# Patient Record
Sex: Male | Born: 1935 | Race: White | Hispanic: No | Marital: Married | Smoking: Never smoker
Health system: Southern US, Community
[De-identification: ages and names within clinical notes are randomized; demographics above are authoritative.]

## PROBLEM LIST (undated history)

## (undated) DIAGNOSIS — I82409 Acute embolism and thrombosis of unspecified deep veins of unspecified lower extremity: Secondary | ICD-10-CM

## (undated) DIAGNOSIS — Z952 Presence of prosthetic heart valve: Secondary | ICD-10-CM

## (undated) DIAGNOSIS — R52 Pain, unspecified: Secondary | ICD-10-CM

## (undated) DIAGNOSIS — I10 Essential (primary) hypertension: Secondary | ICD-10-CM

## (undated) DIAGNOSIS — K219 Gastro-esophageal reflux disease without esophagitis: Secondary | ICD-10-CM

## (undated) DIAGNOSIS — I4891 Unspecified atrial fibrillation: Secondary | ICD-10-CM

## (undated) DIAGNOSIS — I359 Nonrheumatic aortic valve disorder, unspecified: Secondary | ICD-10-CM

## (undated) DIAGNOSIS — E785 Hyperlipidemia, unspecified: Secondary | ICD-10-CM

## (undated) DIAGNOSIS — E119 Type 2 diabetes mellitus without complications: Secondary | ICD-10-CM

---

## 2004-02-01 ENCOUNTER — Other Ambulatory Visit: Payer: Self-pay

## 2004-02-22 ENCOUNTER — Inpatient Hospital Stay: Payer: Self-pay | Admitting: General Practice

## 2004-02-28 ENCOUNTER — Encounter: Payer: Self-pay | Admitting: Internal Medicine

## 2004-03-01 ENCOUNTER — Encounter: Payer: Self-pay | Admitting: Internal Medicine

## 2004-03-31 ENCOUNTER — Encounter: Payer: Self-pay | Admitting: Internal Medicine

## 2004-05-01 ENCOUNTER — Encounter: Payer: Self-pay | Admitting: Internal Medicine

## 2004-06-01 ENCOUNTER — Encounter: Payer: Self-pay | Admitting: Internal Medicine

## 2004-06-29 ENCOUNTER — Encounter: Payer: Self-pay | Admitting: Internal Medicine

## 2004-07-30 ENCOUNTER — Encounter: Payer: Self-pay | Admitting: Internal Medicine

## 2004-08-29 ENCOUNTER — Encounter: Payer: Self-pay | Admitting: Internal Medicine

## 2004-09-29 ENCOUNTER — Encounter: Payer: Self-pay | Admitting: Internal Medicine

## 2004-10-29 ENCOUNTER — Encounter: Payer: Self-pay | Admitting: Internal Medicine

## 2004-11-29 ENCOUNTER — Encounter: Payer: Self-pay | Admitting: Internal Medicine

## 2004-12-30 ENCOUNTER — Encounter: Payer: Self-pay | Admitting: Internal Medicine

## 2005-01-29 ENCOUNTER — Encounter: Payer: Self-pay | Admitting: Internal Medicine

## 2005-03-01 ENCOUNTER — Encounter: Payer: Self-pay | Admitting: Internal Medicine

## 2005-03-31 ENCOUNTER — Encounter: Payer: Self-pay | Admitting: Internal Medicine

## 2005-05-01 ENCOUNTER — Encounter: Payer: Self-pay | Admitting: Internal Medicine

## 2005-06-01 ENCOUNTER — Encounter: Payer: Self-pay | Admitting: Internal Medicine

## 2005-06-29 ENCOUNTER — Encounter: Payer: Self-pay | Admitting: Internal Medicine

## 2005-07-30 ENCOUNTER — Encounter: Payer: Self-pay | Admitting: Internal Medicine

## 2005-08-29 ENCOUNTER — Encounter: Payer: Self-pay | Admitting: Internal Medicine

## 2005-09-29 ENCOUNTER — Encounter: Payer: Self-pay | Admitting: Internal Medicine

## 2005-10-29 ENCOUNTER — Encounter: Payer: Self-pay | Admitting: Internal Medicine

## 2005-11-29 ENCOUNTER — Encounter: Payer: Self-pay | Admitting: Internal Medicine

## 2005-12-30 ENCOUNTER — Encounter: Payer: Self-pay | Admitting: Internal Medicine

## 2006-01-29 ENCOUNTER — Encounter: Payer: Self-pay | Admitting: Internal Medicine

## 2006-03-01 ENCOUNTER — Encounter: Payer: Self-pay | Admitting: Internal Medicine

## 2007-05-07 ENCOUNTER — Encounter: Payer: Self-pay | Admitting: Internal Medicine

## 2007-06-02 ENCOUNTER — Encounter: Payer: Self-pay | Admitting: Internal Medicine

## 2008-02-21 ENCOUNTER — Ambulatory Visit: Payer: Self-pay | Admitting: Cardiology

## 2014-11-03 ENCOUNTER — Encounter: Payer: Self-pay | Admitting: *Deleted

## 2014-11-03 ENCOUNTER — Emergency Department
Admission: EM | Admit: 2014-11-03 | Discharge: 2014-11-03 | Disposition: A | Discharge status: Home / Self Care | Payer: Commercial Managed Care - HMO | Attending: Emergency Medicine | Admitting: Emergency Medicine

## 2014-11-03 DIAGNOSIS — G8929 Other chronic pain: Secondary | ICD-10-CM | POA: Insufficient documentation

## 2014-11-03 DIAGNOSIS — M549 Dorsalgia, unspecified: Secondary | ICD-10-CM | POA: Insufficient documentation

## 2014-11-03 DIAGNOSIS — I10 Essential (primary) hypertension: Secondary | ICD-10-CM | POA: Insufficient documentation

## 2014-11-03 DIAGNOSIS — Z79899 Other long term (current) drug therapy: Secondary | ICD-10-CM | POA: Diagnosis not present

## 2014-11-03 DIAGNOSIS — I4891 Unspecified atrial fibrillation: Secondary | ICD-10-CM | POA: Diagnosis not present

## 2014-11-03 DIAGNOSIS — Z7901 Long term (current) use of anticoagulants: Secondary | ICD-10-CM | POA: Diagnosis not present

## 2014-11-03 DIAGNOSIS — E119 Type 2 diabetes mellitus without complications: Secondary | ICD-10-CM | POA: Diagnosis not present

## 2014-11-03 DIAGNOSIS — R000 Tachycardia, unspecified: Secondary | ICD-10-CM | POA: Diagnosis present

## 2014-11-03 HISTORY — DX: Nonrheumatic aortic valve disorder, unspecified: I35.9

## 2014-11-03 HISTORY — DX: Essential (primary) hypertension: I10

## 2014-11-03 HISTORY — DX: Hyperlipidemia, unspecified: E78.5

## 2014-11-03 HISTORY — DX: Presence of prosthetic heart valve: Z95.2

## 2014-11-03 HISTORY — DX: Gastro-esophageal reflux disease without esophagitis: K21.9

## 2014-11-03 HISTORY — DX: Pain, unspecified: R52

## 2014-11-03 HISTORY — DX: Acute embolism and thrombosis of unspecified deep veins of unspecified lower extremity: I82.409

## 2014-11-03 HISTORY — DX: Type 2 diabetes mellitus without complications: E11.9

## 2014-11-03 HISTORY — DX: Unspecified atrial fibrillation: I48.91

## 2014-11-03 LAB — BASIC METABOLIC PANEL
ANION GAP: 9 (ref 5–15)
BUN: 17 mg/dL (ref 6–20)
CO2: 29 mmol/L (ref 22–32)
Calcium: 9.6 mg/dL (ref 8.9–10.3)
Chloride: 106 mmol/L (ref 101–111)
Creatinine, Ser: 0.92 mg/dL (ref 0.61–1.24)
GFR calc Af Amer: >60 mL/min (ref >60)
Glucose, Bld: 105 mg/dL — ABNORMAL HIGH (ref 65–99)
Potassium: 3.9 mmol/L (ref 3.5–5.1)
SODIUM: 144 mmol/L (ref 135–145)

## 2014-11-03 LAB — CBC
HEMATOCRIT: 42.2 % (ref 40.0–52.0)
Hemoglobin: 13.8 g/dL (ref 13.0–18.0)
MCH: 31.7 pg (ref 26.0–34.0)
MCHC: 32.6 g/dL (ref 32.0–36.0)
MCV: 97.1 fL (ref 80.0–100.0)
PLATELETS: 213 10*3/uL (ref 150–440)
RBC: 4.35 MIL/uL — ABNORMAL LOW (ref 4.40–5.90)
RDW: 13.4 % (ref 11.5–14.5)
WBC: 5.9 10*3/uL (ref 3.8–10.6)

## 2014-11-03 LAB — PROTIME-INR
INR: 1.75
Prothrombin Time: 20.6 seconds — ABNORMAL HIGH (ref 11.4–15.0)

## 2014-11-03 LAB — TROPONIN I: Troponin I: <0.03 ng/mL (ref <0.031)

## 2014-11-03 NOTE — ED Notes (Addendum)
Pt to ed with c/o rapid heart rate earlier today,  Pt states he went to [facility] and obtained an EKG,  Pt then was told to go to be evaluated.  Pt brought to ed from [facility],  [place] from [facility] reported bp 158/80 and HR of 80 and irregular HR.  Pt denies chest pain at this time.

## 2014-11-03 NOTE — ED Notes (Signed)

## 2014-11-03 NOTE — ED Provider Notes (Signed)
[facility] Emergency Department Provider Note  ____________________________________________  Time seen: Approximately 7:22 PM  I have reviewed the triage vital signs and the nursing notes.   HISTORY  Chief Complaint Hypertension and Tachycardia    HPI Danny Bernard is a 81 y.o. male who said that he went to the fire department today to have his blood pressure checked as he often does, but he also noticed briefly after taking a nap at his heart felt like it was beating fast,  while at [place] he was told his heart rate was fast, and they advised him to come to the hospital.  Mr. Danny Bernard denies having any symptoms. No chest pain or shortness of breath. He states that he is "riddled with arthritis, but could play shortstop and again if he needed to you." I discussed with him for a while, and he and his wife state that he does have a history of atrial fibrillation and he states that he has had that as long as he can remember and is under Dr. Velia Meyer care. He denies any other symptoms. No trouble breathing, no shortness of breath, no swelling in his legs, no chest pain or other concerns.  He feels well at this time.   Past Medical History  Diagnosis Date  . Heart valve replaced   . Hypertension   . Pain   . GERD (gastroesophageal reflux disease)   . DVT (deep venous thrombosis)   . Diabetes mellitus without complication   . A-fib   . Aortic valve disease   . Hyperlipemia     There are no active problems to display for this patient.   History reviewed. No pertinent past surgical history.  Current Outpatient Rx  Name  Route  Sig  Dispense  Refill  . amLODipine (NORVASC) 10 MG tablet   Oral   Take 1 tablet by mouth daily.         . hydrochlorothiazide (HYDRODIURIL) 25 MG tablet   Oral   Take 1 tablet by mouth daily.         Marland Kitchen losartan (COZAAR) 100 MG tablet   Oral   Take 1 tablet by mouth daily.         Marland Kitchen omeprazole (PRILOSEC) 40 MG  capsule   Oral   Take 1 capsule by mouth daily.         Marland Kitchen warfarin (COUMADIN) 5 MG tablet   Oral   Take 1 tablet by mouth daily. Take 1 tablet on day and 1/2 a tablet the next day.           Allergies Review of patient's allergies indicates no known allergies.  No family history on file.  Social History History  Substance Use Topics  . Smoking status: Never Smoker   . Smokeless tobacco: Never Used  . Alcohol Use: No    Review of Systems Constitutional: No fever/chills Eyes: No visual changes. ENT: No sore throat. Cardiovascular: Denies chest pain. Respiratory: Denies shortness of breath. Gastrointestinal: No abdominal pain.  No nausea, no vomiting.  No diarrhea.  No constipation. Genitourinary: Negative for dysuria. Musculoskeletal: Chronic long-standing back pain, and severe arthritis throughout his back and all joints. Skin: Negative for rash. Neurological: Negative for headaches, focal weakness or numbness.  10-point ROS otherwise negative.  ____________________________________________   PHYSICAL EXAM:  VITAL SIGNS: ED Triage Vitals  Enc Vitals Group     BP 11/03/14 1653 163/102 mmHg     Pulse Rate 11/03/14 1653 56  Resp 11/03/14 1653 18     Temp 11/03/14 1653 98.2 F (36.8 C)     Temp Source 11/03/14 1653 Oral     SpO2 11/03/14 1653 97 %     Weight 11/03/14 1653 251 lb (113.853 kg)     Height 11/03/14 1653 5\' 7"  (1.702 m)     Head Cir --      Peak Flow --      Pain Score --      Pain Loc --      Pain Edu? --      Excl. in [place]? --     Constitutional: Alert and oriented. Well appearing and in no acute distress. Patient is very very hard of hearing, and at times this seems to make him appear confused, though in speaking up and asking questions clearly he does respond well. He reports that it is July, 2016, and he is able to give good history of today's events. Eyes: Conjunctivae are normal. PERRL. EOMI. Head: Atraumatic. Nose: No  congestion/rhinnorhea. Mouth/Throat: Mucous membranes are moist.  Oropharynx non-erythematous. Neck: No stridor.   Cardiovascular: Irregularly irregular rhythm. Grossly normal heart sounds.  Good peripheral circulation. Respiratory: Normal respiratory effort.  No retractions. Lungs CTAB. Gastrointestinal: Soft and nontender. No distention. No abdominal bruits. No CVA tenderness. Musculoskeletal: No lower extremity tenderness nor edema.  No joint effusions. Neurologic:  Normal speech and language. No gross focal neurologic deficits are appreciated. Speech is normal. No gait instability. Skin:  Skin is warm, dry and intact. No rash noted. Psychiatric: Mood and affect are normal. Speech and behavior are normal.  ____________________________________________   LABS (all labs ordered are listed, but only abnormal results are displayed)  Labs Reviewed  BASIC METABOLIC PANEL - Abnormal; Notable for the following:    Glucose, Bld 105 (*)    All other components within normal limits  CBC - Abnormal; Notable for the following:    RBC 4.35 (*)    All other components within normal limits  PROTIME-INR - Abnormal; Notable for the following:    Prothrombin Time 20.6 (*)    All other components within normal limits  TROPONIN I   ____________________________________________  EKG  Reviewed and interpreted by me Atrial fibrillation with occasional PVCs. Right bundle-branch block. There is no evidence of acute ischemic abnormality. Rate is controlled at 90.  QRS 142 QTC 500  Aside from right bundle-branch block and atrial fibrillation is otherwise unremarkable, I do not see any evidence of acute ischemia. ____________________________________________  RADIOLOGY   ____________________________________________   PROCEDURES  Procedure(s) performed: None  Critical Care performed: No  ____________________________________________   INITIAL IMPRESSION / ASSESSMENT AND PLAN / ED  COURSE  Pertinent labs & imaging results that were available during my care of the patient were reviewed by me and considered in my medical decision making (see chart for details).  Patient presents for evaluation of elevated heart rate for which she appears to be asymptomatic at the fire department today. He is alert, slightly confused as to some details but his wife reports this is chronic and he is thought to have some mild dementia. His EKG shows atrial fibrillation that is well controlled with a rate in the 90s. He is not unstable and not having any active cardiopulmonary complaint and denies having had any associated symptoms.  Per the patient and his wife this is long-standing. As he is asymptomatic and there is no acute concern, I am going to discharge him and refer him to Dr.  Fath for follow-up.  Patient and his wife are both very agreeable with the plan.  : Spoke with Dr. Ubaldo Glassing, we reviewed patient's symptoms, EKG, and laboratory analysis. Dr. Satira Mccallum recommends discharging the patient to home and that he can call and they will set up a clinic appointment for the patient in the next 1-2 days. I discussed this with the patient and his wife both are agreeable with this and return precautions including any chest pain, weakness, trouble breathing, or other new concerns.   ____________________________________________   FINAL CLINICAL IMPRESSION(S) / ED DIAGNOSES  Final diagnoses:  Atrial fibrillation, unspecified      Delman Kitten, MD 11/03/14 1958

## 2014-11-03 NOTE — Discharge Instructions (Signed)
Atrial Fibrillation  Please call Dr. Bethanne Ginger office at 8 AM to set up a follow-up appointment within the next 1-2 days. Return to the ER right away if you develop any chest pain, trouble breathing, weakness, feel lightheaded or grossly may pass out, noticed her heart is racing, or other new concerns arise.  Atrial fibrillation is a type of irregular heart rhythm (arrhythmia). During atrial fibrillation, the upper chambers of the heart (atria) quiver continuously in a chaotic pattern. This causes an irregular and often rapid heart rate.  Atrial fibrillation is the result of the heart becoming overloaded with disorganized signals that tell it to beat. These signals are normally released one at a time by a part of the right atrium called the sinoatrial node. They then travel from the atria to the lower chambers of the heart (ventricles), causing the atria and ventricles to contract and pump blood as they pass. In atrial fibrillation, parts of the atria outside of the sinoatrial node also release these signals. This results in two problems. First, the atria receive so many signals that they do not have time to fully contract. Second, the ventricles, which can only receive one signal at a time, beat irregularly and out of rhythm with the atria.  There are three types of atrial fibrillation:   Paroxysmal. Paroxysmal atrial fibrillation starts suddenly and stops on its own within a week.  Persistent. Persistent atrial fibrillation lasts for more than a week. It may stop on its own or with treatment.  Permanent. Permanent atrial fibrillation does not go away. Episodes of atrial fibrillation may lead to permanent atrial fibrillation. Atrial fibrillation can prevent your heart from pumping blood normally. It increases your risk of stroke and can lead to heart failure.  CAUSES   Heart conditions, including a heart attack, heart failure, coronary artery disease, and heart valve conditions.   Inflammation of  the sac that surrounds the heart (pericarditis).  Blockage of an artery in the lungs (pulmonary embolism).  Pneumonia or other infections.  Chronic lung disease.  Thyroid problems, especially if the thyroid is overactive (hyperthyroidism).  Caffeine, excessive alcohol use, and use of some illegal drugs.   Use of some medicines, including certain decongestants and diet pills.  Heart surgery.   Birth defects.  Sometimes, no cause can be found. When this happens, the atrial fibrillation is called lone atrial fibrillation. The risk of complications from atrial fibrillation increases if you have lone atrial fibrillation and you are age 76 years or older. RISK FACTORS  Heart failure.  Coronary artery disease.  Diabetes mellitus.   High blood pressure (hypertension).   Obesity.   Other arrhythmias.   Increased age. SIGNS AND SYMPTOMS   A feeling that your heart is beating rapidly or irregularly.   A feeling of discomfort or pain in your chest.   Shortness of breath.   Sudden light-headedness or weakness.   Getting tired easily when exercising.   Urinating more often than normal (mainly when atrial fibrillation first begins).  In paroxysmal atrial fibrillation, symptoms may start and suddenly stop. DIAGNOSIS  Your health care provider may be able to detect atrial fibrillation when taking your pulse. Your health care provider may have you take a test called an ambulatory electrocardiogram (ECG). An ECG records your heartbeat patterns over a 24-hour period. You may also have other tests, such as:  Transthoracic echocardiogram (TTE). During echocardiography, sound waves are used to evaluate how blood flows through your heart.  Transesophageal echocardiogram (TEE).  Stress  test. There is more than one type of stress test. If a stress test is needed, ask your health care provider about which type is best for you.  Chest X-ray exam.  Blood tests.  Computed  tomography (CT). TREATMENT  Treatment may include:  Treating any underlying conditions. For example, if you have an overactive thyroid, treating the condition may correct atrial fibrillation.  Taking medicine. Medicines may be given to control a rapid heart rate or to prevent blood clots, heart failure, or a stroke.  Having a procedure to correct the rhythm of the heart:  Electrical cardioversion. During electrical cardioversion, a controlled, low-energy shock is delivered to the heart through your skin. If you have chest pain, very low blood pressure, or sudden heart failure, this procedure may need to be done as an emergency.  Catheter ablation. During this procedure, heart tissues that send the signals that cause atrial fibrillation are destroyed.  Surgical ablation. During this surgery, thin lines of heart tissue that carry the abnormal signals are destroyed. This procedure can either be an open-heart surgery or a minimally invasive surgery. With the minimally invasive surgery, small cuts are made to access the heart instead of a large opening.  Pulmonary venous isolation. During this surgery, tissue around the veins that carry blood from the lungs (pulmonary veins) is destroyed. This tissue is thought to carry the abnormal signals. HOME CARE INSTRUCTIONS   Take medicines only as directed by your health care provider. Some medicines can make atrial fibrillation worse or recur.  If blood thinners were prescribed by your health care provider, take them exactly as directed. Too much blood-thinning medicine can cause bleeding. If you take too little, you will not have the needed protection against stroke and other problems.  Perform blood tests at home if directed by your health care provider. Perform blood tests exactly as directed.  Quit smoking if you smoke.  Do not drink alcohol.  Do not drink caffeinated beverages such as coffee, soda, and some teas. You may drink decaffeinated  coffee, soda, or tea.   Maintain a healthy weight.Do not use diet pills unless your health care provider approves. They may make heart problems worse.   Follow diet instructions as directed by your health care provider.  Exercise regularly as directed by your health care provider.  Keep all follow-up visits as directed by your health care provider. This is important. PREVENTION  The following substances can cause atrial fibrillation to recur:   Caffeinated beverages.  Alcohol.  Certain medicines, especially those used for breathing problems.  Certain herbs and herbal medicines, such as those containing ephedra or ginseng.  Illegal drugs, such as cocaine and amphetamines. Sometimes medicines are given to prevent atrial fibrillation from recurring. Proper treatment of any underlying condition is also important in helping prevent recurrence.  SEEK MEDICAL CARE IF:  You notice a change in the rate, rhythm, or strength of your heartbeat.  You suddenly begin urinating more frequently.  You tire more easily when exerting yourself or exercising. SEEK IMMEDIATE MEDICAL CARE IF:   You have chest pain, abdominal pain, sweating, or weakness.  You feel nauseous.  You have shortness of breath.  You suddenly have swollen feet and ankles.  You feel dizzy.  Your face or limbs feel numb or weak.  You have a change in your vision or speech. MAKE SURE YOU:   Understand these instructions.  Will watch your condition.  Will get help right away if you are not doing well  or get worse. Document Released: 04/17/2005 Document Revised: 09/01/2013 Document Reviewed: 05/28/2012 [facility] Patient Information 2015 [place], [place]. This information is not intended to replace advice given to you by your health care provider. Make sure you discuss any questions you have with your health care provider.

## 2014-11-03 NOTE — ED Notes (Signed)
Patient present to ED with complaint of heart racing earlier today, per wife patient has history of "irregular heart beat at times." Patient states went to fire dept to check blood pressure, states "I didn't trust my thing (blood pressure machine.)" Patient appears confused at times. When asked if patient has history of atrial fibrillation patient reports "I had it my entire life." Patient denies chest pain, abdominal pain, or fever. Patient states he was taking a nap and reports when he woke up "my heart was beating real fast." Patient reports "it's the only time it has ever felt like that." Patient alert, calm and cooperative. Family at bedside, patient placed on cardiac monitor, MD at bedside.

## 2014-11-03 NOTE — ED Notes (Signed)
Pt here with c/o heart racing earlier.  Pt advises it's "OK" now but he doesn't feel good.

## 2015-05-27 DIAGNOSIS — E119 Type 2 diabetes mellitus without complications: Secondary | ICD-10-CM | POA: Diagnosis not present

## 2015-05-27 DIAGNOSIS — Z0000 Encounter for general adult medical examination without abnormal findings: Secondary | ICD-10-CM | POA: Diagnosis not present

## 2015-05-27 DIAGNOSIS — Z79899 Other long term (current) drug therapy: Secondary | ICD-10-CM | POA: Diagnosis not present

## 2015-07-20 DIAGNOSIS — B351 Tinea unguium: Secondary | ICD-10-CM | POA: Diagnosis not present

## 2015-07-20 DIAGNOSIS — E1142 Type 2 diabetes mellitus with diabetic polyneuropathy: Secondary | ICD-10-CM | POA: Diagnosis not present

## 2015-07-20 DIAGNOSIS — L851 Acquired keratosis [keratoderma] palmaris et plantaris: Secondary | ICD-10-CM | POA: Diagnosis not present

## 2015-07-29 DIAGNOSIS — L578 Other skin changes due to chronic exposure to nonionizing radiation: Secondary | ICD-10-CM | POA: Diagnosis not present

## 2015-07-29 DIAGNOSIS — L821 Other seborrheic keratosis: Secondary | ICD-10-CM | POA: Diagnosis not present

## 2015-07-29 DIAGNOSIS — L820 Inflamed seborrheic keratosis: Secondary | ICD-10-CM | POA: Diagnosis not present

## 2015-07-29 DIAGNOSIS — D229 Melanocytic nevi, unspecified: Secondary | ICD-10-CM | POA: Diagnosis not present

## 2015-07-29 DIAGNOSIS — L98421 Non-pressure chronic ulcer of back limited to breakdown of skin: Secondary | ICD-10-CM | POA: Diagnosis not present

## 2015-08-19 DIAGNOSIS — L820 Inflamed seborrheic keratosis: Secondary | ICD-10-CM | POA: Diagnosis not present

## 2015-08-19 DIAGNOSIS — L821 Other seborrheic keratosis: Secondary | ICD-10-CM | POA: Diagnosis not present

## 2015-08-19 DIAGNOSIS — D485 Neoplasm of uncertain behavior of skin: Secondary | ICD-10-CM | POA: Diagnosis not present

## 2015-08-19 DIAGNOSIS — Z85828 Personal history of other malignant neoplasm of skin: Secondary | ICD-10-CM | POA: Diagnosis not present

## 2015-08-19 DIAGNOSIS — L578 Other skin changes due to chronic exposure to nonionizing radiation: Secondary | ICD-10-CM | POA: Diagnosis not present

## 2015-08-19 DIAGNOSIS — L570 Actinic keratosis: Secondary | ICD-10-CM | POA: Diagnosis not present

## 2015-08-19 DIAGNOSIS — Z1283 Encounter for screening for malignant neoplasm of skin: Secondary | ICD-10-CM | POA: Diagnosis not present

## 2015-09-13 DIAGNOSIS — R319 Hematuria, unspecified: Secondary | ICD-10-CM | POA: Diagnosis not present

## 2015-10-19 DIAGNOSIS — Z79899 Other long term (current) drug therapy: Secondary | ICD-10-CM | POA: Diagnosis not present

## 2015-10-19 DIAGNOSIS — E119 Type 2 diabetes mellitus without complications: Secondary | ICD-10-CM | POA: Diagnosis not present

## 2015-10-21 DIAGNOSIS — D1800 Hemangioma unspecified site: Secondary | ICD-10-CM | POA: Diagnosis not present

## 2015-10-21 DIAGNOSIS — L820 Inflamed seborrheic keratosis: Secondary | ICD-10-CM | POA: Diagnosis not present

## 2015-10-21 DIAGNOSIS — L853 Xerosis cutis: Secondary | ICD-10-CM | POA: Diagnosis not present

## 2015-10-21 DIAGNOSIS — L812 Freckles: Secondary | ICD-10-CM | POA: Diagnosis not present

## 2015-10-21 DIAGNOSIS — D229 Melanocytic nevi, unspecified: Secondary | ICD-10-CM | POA: Diagnosis not present

## 2015-10-21 DIAGNOSIS — L578 Other skin changes due to chronic exposure to nonionizing radiation: Secondary | ICD-10-CM | POA: Diagnosis not present

## 2015-10-21 DIAGNOSIS — L570 Actinic keratosis: Secondary | ICD-10-CM | POA: Diagnosis not present

## 2015-10-21 DIAGNOSIS — L821 Other seborrheic keratosis: Secondary | ICD-10-CM | POA: Diagnosis not present

## 2015-10-26 DIAGNOSIS — I10 Essential (primary) hypertension: Secondary | ICD-10-CM | POA: Diagnosis not present

## 2015-10-26 DIAGNOSIS — E119 Type 2 diabetes mellitus without complications: Secondary | ICD-10-CM | POA: Diagnosis not present

## 2015-10-26 DIAGNOSIS — K219 Gastro-esophageal reflux disease without esophagitis: Secondary | ICD-10-CM | POA: Diagnosis not present

## 2015-10-26 DIAGNOSIS — Z79899 Other long term (current) drug therapy: Secondary | ICD-10-CM | POA: Diagnosis not present

## 2015-10-26 DIAGNOSIS — I482 Chronic atrial fibrillation: Secondary | ICD-10-CM | POA: Diagnosis not present

## 2015-11-09 DIAGNOSIS — S93505A Unspecified sprain of left lesser toe(s), initial encounter: Secondary | ICD-10-CM | POA: Diagnosis not present

## 2015-11-09 DIAGNOSIS — M79672 Pain in left foot: Secondary | ICD-10-CM | POA: Diagnosis not present

## 2015-12-17 DIAGNOSIS — Z79899 Other long term (current) drug therapy: Secondary | ICD-10-CM | POA: Diagnosis not present

## 2016-01-14 DIAGNOSIS — L578 Other skin changes due to chronic exposure to nonionizing radiation: Secondary | ICD-10-CM | POA: Diagnosis not present

## 2016-01-14 DIAGNOSIS — D045 Carcinoma in situ of skin of trunk: Secondary | ICD-10-CM | POA: Diagnosis not present

## 2016-01-14 DIAGNOSIS — C44519 Basal cell carcinoma of skin of other part of trunk: Secondary | ICD-10-CM | POA: Diagnosis not present

## 2016-01-14 DIAGNOSIS — L821 Other seborrheic keratosis: Secondary | ICD-10-CM | POA: Diagnosis not present

## 2016-01-14 DIAGNOSIS — D485 Neoplasm of uncertain behavior of skin: Secondary | ICD-10-CM | POA: Diagnosis not present

## 2016-01-14 DIAGNOSIS — D229 Melanocytic nevi, unspecified: Secondary | ICD-10-CM | POA: Diagnosis not present

## 2016-01-14 DIAGNOSIS — L812 Freckles: Secondary | ICD-10-CM | POA: Diagnosis not present

## 2016-02-15 DIAGNOSIS — D045 Carcinoma in situ of skin of trunk: Secondary | ICD-10-CM | POA: Diagnosis not present

## 2016-02-15 DIAGNOSIS — C44519 Basal cell carcinoma of skin of other part of trunk: Secondary | ICD-10-CM | POA: Diagnosis not present

## 2016-04-07 DIAGNOSIS — I10 Essential (primary) hypertension: Secondary | ICD-10-CM | POA: Diagnosis not present

## 2016-04-07 DIAGNOSIS — I482 Chronic atrial fibrillation: Secondary | ICD-10-CM | POA: Diagnosis not present

## 2016-04-07 DIAGNOSIS — Z79899 Other long term (current) drug therapy: Secondary | ICD-10-CM | POA: Diagnosis not present

## 2016-04-07 DIAGNOSIS — R42 Dizziness and giddiness: Secondary | ICD-10-CM | POA: Diagnosis not present

## 2016-05-11 DIAGNOSIS — E1142 Type 2 diabetes mellitus with diabetic polyneuropathy: Secondary | ICD-10-CM | POA: Diagnosis not present

## 2016-06-07 DIAGNOSIS — L851 Acquired keratosis [keratoderma] palmaris et plantaris: Secondary | ICD-10-CM | POA: Diagnosis not present

## 2016-06-07 DIAGNOSIS — E1142 Type 2 diabetes mellitus with diabetic polyneuropathy: Secondary | ICD-10-CM | POA: Diagnosis not present

## 2016-06-07 DIAGNOSIS — B351 Tinea unguium: Secondary | ICD-10-CM | POA: Diagnosis not present

## 2016-06-14 DIAGNOSIS — D485 Neoplasm of uncertain behavior of skin: Secondary | ICD-10-CM | POA: Diagnosis not present

## 2016-06-14 DIAGNOSIS — Z85828 Personal history of other malignant neoplasm of skin: Secondary | ICD-10-CM | POA: Diagnosis not present

## 2016-06-14 DIAGNOSIS — L578 Other skin changes due to chronic exposure to nonionizing radiation: Secondary | ICD-10-CM | POA: Diagnosis not present

## 2016-06-14 DIAGNOSIS — D229 Melanocytic nevi, unspecified: Secondary | ICD-10-CM | POA: Diagnosis not present

## 2016-06-14 DIAGNOSIS — C44529 Squamous cell carcinoma of skin of other part of trunk: Secondary | ICD-10-CM | POA: Diagnosis not present

## 2016-06-14 DIAGNOSIS — L821 Other seborrheic keratosis: Secondary | ICD-10-CM | POA: Diagnosis not present

## 2016-06-14 DIAGNOSIS — L820 Inflamed seborrheic keratosis: Secondary | ICD-10-CM | POA: Diagnosis not present

## 2016-06-14 DIAGNOSIS — Z1283 Encounter for screening for malignant neoplasm of skin: Secondary | ICD-10-CM | POA: Diagnosis not present

## 2016-06-14 DIAGNOSIS — L570 Actinic keratosis: Secondary | ICD-10-CM | POA: Diagnosis not present

## 2016-06-29 ENCOUNTER — Emergency Department: Payer: PPO

## 2016-06-29 ENCOUNTER — Encounter: Payer: Self-pay | Admitting: Emergency Medicine

## 2016-06-29 ENCOUNTER — Emergency Department
Admission: EM | Admit: 2016-06-29 | Discharge: 2016-06-29 | Disposition: A | Discharge status: Home / Self Care | Payer: PPO | Attending: Emergency Medicine | Admitting: Emergency Medicine

## 2016-06-29 DIAGNOSIS — I10 Essential (primary) hypertension: Secondary | ICD-10-CM | POA: Insufficient documentation

## 2016-06-29 DIAGNOSIS — Y939 Activity, unspecified: Secondary | ICD-10-CM | POA: Diagnosis not present

## 2016-06-29 DIAGNOSIS — M79601 Pain in right arm: Secondary | ICD-10-CM | POA: Diagnosis not present

## 2016-06-29 DIAGNOSIS — Y999 Unspecified external cause status: Secondary | ICD-10-CM | POA: Insufficient documentation

## 2016-06-29 DIAGNOSIS — W1839XA Other fall on same level, initial encounter: Secondary | ICD-10-CM | POA: Diagnosis not present

## 2016-06-29 DIAGNOSIS — R102 Pelvic and perineal pain: Secondary | ICD-10-CM | POA: Diagnosis not present

## 2016-06-29 DIAGNOSIS — E119 Type 2 diabetes mellitus without complications: Secondary | ICD-10-CM | POA: Diagnosis not present

## 2016-06-29 DIAGNOSIS — S3992XA Unspecified injury of lower back, initial encounter: Secondary | ICD-10-CM | POA: Diagnosis not present

## 2016-06-29 DIAGNOSIS — S3993XA Unspecified injury of pelvis, initial encounter: Secondary | ICD-10-CM | POA: Diagnosis not present

## 2016-06-29 DIAGNOSIS — W19XXXA Unspecified fall, initial encounter: Secondary | ICD-10-CM | POA: Diagnosis not present

## 2016-06-29 DIAGNOSIS — M25551 Pain in right hip: Secondary | ICD-10-CM | POA: Diagnosis not present

## 2016-06-29 DIAGNOSIS — M549 Dorsalgia, unspecified: Secondary | ICD-10-CM | POA: Diagnosis not present

## 2016-06-29 DIAGNOSIS — Z79899 Other long term (current) drug therapy: Secondary | ICD-10-CM | POA: Diagnosis not present

## 2016-06-29 DIAGNOSIS — M79604 Pain in right leg: Secondary | ICD-10-CM | POA: Diagnosis not present

## 2016-06-29 DIAGNOSIS — S300XXA Contusion of lower back and pelvis, initial encounter: Secondary | ICD-10-CM | POA: Diagnosis not present

## 2016-06-29 DIAGNOSIS — Y92009 Unspecified place in unspecified non-institutional (private) residence as the place of occurrence of the external cause: Secondary | ICD-10-CM | POA: Diagnosis not present

## 2016-06-29 DIAGNOSIS — M79651 Pain in right thigh: Secondary | ICD-10-CM | POA: Diagnosis not present

## 2016-06-29 DIAGNOSIS — M546 Pain in thoracic spine: Secondary | ICD-10-CM | POA: Diagnosis not present

## 2016-06-29 DIAGNOSIS — S79911A Unspecified injury of right hip, initial encounter: Secondary | ICD-10-CM | POA: Diagnosis not present

## 2016-06-29 LAB — COMPREHENSIVE METABOLIC PANEL
ALK PHOS: 56 U/L (ref 38–126)
ALT: 18 U/L (ref 17–63)
ANION GAP: 7 (ref 5–15)
AST: 23 U/L (ref 15–41)
Albumin: 4 g/dL (ref 3.5–5.0)
BILIRUBIN TOTAL: 1.2 mg/dL (ref 0.3–1.2)
BUN: 19 mg/dL (ref 6–20)
CALCIUM: 9.5 mg/dL (ref 8.9–10.3)
CO2: 30 mmol/L (ref 22–32)
Chloride: 104 mmol/L (ref 101–111)
Creatinine, Ser: 0.85 mg/dL (ref 0.61–1.24)
GFR calc Af Amer: >60 mL/min (ref >60)
Glucose, Bld: 152 mg/dL — ABNORMAL HIGH (ref 65–99)
POTASSIUM: 4 mmol/L (ref 3.5–5.1)
Sodium: 141 mmol/L (ref 135–145)
TOTAL PROTEIN: 7 g/dL (ref 6.5–8.1)

## 2016-06-29 LAB — CBC WITH DIFFERENTIAL/PLATELET
Basophils Absolute: 0 10*3/uL (ref 0–0.1)
Basophils Relative: 0 %
Eosinophils Absolute: 0 10*3/uL (ref 0–0.7)
Eosinophils Relative: 0 %
HEMATOCRIT: 39.7 % — AB (ref 40.0–52.0)
HEMOGLOBIN: 13.7 g/dL (ref 13.0–18.0)
LYMPHS PCT: 7 %
Lymphs Abs: 0.8 10*3/uL — ABNORMAL LOW (ref 1.0–3.6)
MCH: 33.5 pg (ref 26.0–34.0)
MCHC: 34.4 g/dL (ref 32.0–36.0)
MCV: 97.2 fL (ref 80.0–100.0)
MONO ABS: 0.9 10*3/uL (ref 0.2–1.0)
MONOS PCT: 8 %
NEUTROS ABS: 9.3 10*3/uL — AB (ref 1.4–6.5)
NEUTROS PCT: 85 %
Platelets: 242 10*3/uL (ref 150–440)
RBC: 4.08 MIL/uL — ABNORMAL LOW (ref 4.40–5.90)
RDW: 14 % (ref 11.5–14.5)
WBC: 11 10*3/uL — ABNORMAL HIGH (ref 3.8–10.6)

## 2016-06-29 MED ORDER — TRAMADOL HCL 50 MG PO TABS
50.0000 mg | ORAL_TABLET | ORAL | Status: AC
  Administered 2016-06-29: 50 mg via ORAL
  Filled 2016-06-29: qty 1

## 2016-06-29 MED ORDER — ACETAMINOPHEN 325 MG PO TABS
650.0000 mg | ORAL_TABLET | Freq: Once | ORAL | Status: AC
  Administered 2016-06-29: 650 mg via ORAL
  Filled 2016-06-29: qty 2

## 2016-06-29 MED ORDER — MORPHINE SULFATE (PF) 2 MG/ML IV SOLN
2.0000 mg | Freq: Once | INTRAVENOUS | Status: AC
  Administered 2016-06-29: 2 mg via INTRAVENOUS
  Filled 2016-06-29: qty 1

## 2016-06-29 NOTE — ED Notes (Signed)
ED Provider at bedside. 

## 2016-06-29 NOTE — ED Notes (Signed)
Pt ambulated around room with 2 person assist and walker. Pt reports of right groin pain, describes it as a "charlie horse". MD informed, son requesting we "ace wrap" his right upper leg for assistance with ambulation.

## 2016-06-29 NOTE — ED Notes (Signed)
Family at bedside. 

## 2016-06-29 NOTE — Discharge Instructions (Signed)

## 2016-06-29 NOTE — ED Provider Notes (Signed)
[facility] Emergency Department Provider Note   ____________________________________________   First MD Initiated Contact with Patient 06/29/16 [phone]     (approximate)  I have reviewed the triage vital signs and the nursing notes.   HISTORY  Chief Complaint Fall    HPI Danny Bernard is a 83 y.o. male here for evaluation of a fall. Patient reports he was placing his shoes on, he leaned forward and stumbled falling landing on his buttock, as well as his right elbow. He did not lose consciousness. He did not strike his head injury as chest or abdomen. 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 Also along the right buttock, feels like a "bruised his tailbone"  No numbness tingling or weakness. He does have slight gait difficulty at baseline because of a previous stroke. He is on a blood thinner, but again denies any head injury or striking his head.  Past Medical History:  Diagnosis Date  . A-fib ([place])   . Aortic valve disease   . Diabetes mellitus without complication ([place])   . DVT (deep venous thrombosis) ([place])   . GERD (gastroesophageal reflux disease)   . Heart valve replaced   . Hyperlipemia   . Hypertension   . Pain     There are no active problems to display for this patient.   History reviewed. No pertinent surgical history.  Prior to Admission medications   Medication Sig Start Date End Date Taking? Authorizing Provider  amLODipine (NORVASC) 10 MG tablet Take 10 mg by mouth daily.   Yes Historical Provider, MD  Apixaban (ELIQUIS PO) Take 5 mg by mouth 2 (two) times daily.   Yes Historical Provider, MD  furosemide (LASIX) 20 MG tablet Take 20 mg by mouth daily.    Yes Historical Provider, MD  gabapentin (NEURONTIN) 300 MG capsule Take 300 mg by mouth daily.     Yes Historical Provider, MD  hydrochlorothiazide (HYDRODIURIL) 25 MG tablet Take 1 tablet by mouth daily. 08/28/14  Yes Historical Provider, MD  losartan (COZAAR) 100 MG tablet Take 1 tablet by mouth daily. 08/26/14  Yes Historical Provider, MD  nebivolol (BYSTOLIC) 5 MG tablet Take 5 mg by mouth daily.   Yes Historical Provider, MD  omeprazole (PRILOSEC) 40 MG capsule Take 1 capsule by mouth daily. 08/28/14  Yes Historical Provider, MD  traMADol (ULTRAM) 50 MG tablet Take 25 mg by mouth every 6 (six) hours as needed.   Yes Historical Provider, MD    Allergies Patient has no known allergies.  No family history on file.  Social History Social History  Substance Use Topics  . Smoking status: Never Smoker  . Smokeless tobacco: Never Used  . Alcohol use No    Review of Systems Constitutional: No fever/chills Eyes: No visual changes. ENT: No sore throat. Cardiovascular: Denies chest pain. Respiratory: Denies shortness of breath. Gastrointestinal: No abdominal pain.  No nausea, no vomiting.  No diarrhea.  No constipation. Genitourinary: Negative for dysuria. Musculoskeletal: Chronic pain in his lower back diagnosis "sciatica" in the past with previous x-rays done in [place]. See history of present illness Skin: Negative for rash. Neurological: Negative for headaches, focal weakness or numbness.    10-point ROS otherwise negative.  ____________________________________________   PHYSICAL EXAM:  VITAL SIGNS: ED Triage Vitals  Enc Vitals Group  BP 06/29/16 0809 111/75     Pulse Rate 06/29/16 0809 67     Resp 06/29/16 0809 16     Temp 06/29/16 0809 97.9 F (36.6 C)     Temp Source 06/29/16 0809 Oral     SpO2 06/29/16 0809 92 %     Weight --      Height --      Head Circumference --      Peak Flow --      Pain Score 06/29/16 0810 6     Pain Loc --      Pain Edu? --      Excl. in [place]? --     Constitutional: Alert and oriented. Well appearing and in no acute  distress.Patient is very hard of hearing Eyes: Conjunctivae are normal. PERRL. EOMI. Head: Atraumatic. Nose: No congestion/rhinnorhea. Mouth/Throat: Mucous membranes are moist.  Oropharynx non-erythematous. Neck: No stridor.  No cervical spine tenderness. Cardiovascular: Normal rate, irregular rhythm. Grossly normal heart sounds.  Good peripheral circulation. Respiratory: Normal respiratory effort.  No retractions. Lungs CTAB. Gastrointestinal: Soft and nontender. No distention.   Musculoskeletal:   Lower Extremities  No edema. Normal DP/PT pulses bilateral with good cap refill.  Normal neuro-motor function lower extremities bilateral.  RIGHT Right lower extremity demonstrates normal strength, good use of all muscles. No edema bruising or contusions of the right hip, right knee, right ankle. Full range of motion of the right lower extremity without pain. No pain on axial loading. No evidence of trauma.  LEFT Left lower extremity demonstrates normal strength, good use of all muscles. No edema bruising or contusions of the hip,  knee, ankle. Full range of motion of the left lower extremity without pain except when palpating along the right posterior buttock he reports it is uncomfortable, and somewhat painful around his tailbone. No pain on axial loading. No evidence of trauma.   RIGHT Right upper extremity demonstrates normal strength, good use of all muscles. No edema bruising or contusions of the right shoulder/upper arm, right elbow, right forearm / hand although there is a slight abrasion overlying the right lateral elbow without fracture/deformity noted. Full range of motion of the right right upper extremity without pain. No evidence of trauma. Strong radial pulse. Intact median/ulnar/radial neuro-muscular exam.  LEFT Left upper extremity demonstrates normal strength, good use of all muscles. No edema bruising or contusions of the left shoulder/upper arm, left elbow, left forearm /  hand. Full range of motion of the left  upper extremity without pain. No evidence of trauma. Strong radial pulse. Intact median/ulnar/radial neuro-muscular exam.   Neurologic:  Normal speech and language. No gross focal neurologic deficits are appreciated. No gait instability. Skin:  Skin is warm, dry and intact. No rash noted. Psychiatric: Mood and affect are normal. Speech and behavior are normal.  ____________________________________________   LABS (all labs ordered are listed, but only abnormal results are displayed)  Labs Reviewed  CBC WITH DIFFERENTIAL/PLATELET - Abnormal; Notable for the following:       Result Value   WBC 11.0 (*)    RBC 4.08 (*)    HCT 39.7 (*)    Neutro Abs 9.3 (*)    Lymphs Abs 0.8 (*)    All other components within normal limits  COMPREHENSIVE METABOLIC PANEL - Abnormal; Notable for the following:    Glucose, Bld 152 (*)    All other components within normal limits   ____________________________________________  EKG  Reviewed injury by me at 8:30 AM Heart  rate 100 QRS 140 QTc 480 A. fib, bundle branch block without evidence of ischemic change. ____________________________________________  RADIOLOGY  Dg Thoracic Spine 2 View  Result Date: 06/29/2016 CLINICAL DATA:  Unwitnessed fall at home earlier today.  Back pain. EXAM: THORACIC SPINE 2 VIEWS COMPARISON:  Lumbar spine reported separately. FINDINGS: There is no evidence of thoracic spine fracture. Alignment is normal. No other significant bone abnormalities are identified. Osteopenia. Flowing anterior osteophytes. Vascular calcification. IMPRESSION: No definite thoracic spine fracture. Electronically Signed   By: Staci Righter M.D.   On: 06/29/2016 10:05   Dg Lumbar Spine 2-3 Views  Result Date: 06/29/2016 CLINICAL DATA:  83 year old male with history of unwitnessed fall at home at 3 a.m. this morning complaining of right-sided leg pain. Back pain. Stomach pain. EXAM: LUMBAR SPINE - 2-3 VIEW  COMPARISON:  No priors. FINDINGS: Three views of the lumbar spine demonstrate no definite acute displaced fracture or compression type fracture. There is 11 mm of anterolisthesis of L5 upon S1 which is likely chronic. Alignment is otherwise anatomic. Severe multilevel degenerative disc disease, most pronounced at L3-L4 and L5-S1. Severe multilevel facet arthropathy, most severe at L4-L5 and L5-S1. Extensive atherosclerotic calcifications throughout the abdominal aorta and pelvic vasculature. IMPRESSION: 1. No acute radiographic abnormality of the lumbar spine. 2. Severe multilevel degenerative disc disease and lumbar spondylosis, as above, with 11 mm of anterolisthesis of L5 upon S1 which is likely chronic. 3. Aortic atherosclerosis. Electronically Signed   By: Vinnie Langton M.D.   On: 06/29/2016 10:03   Dg Pelvis 1-2 Views  Result Date: 06/29/2016 CLINICAL DATA:  Pain following fall EXAM: PELVIS - 1-2 VIEW COMPARISON:  None. FINDINGS: There is no evidence of pelvic fracture or dislocation. There is mild symmetric narrowing of both hip joints. No erosive change. Bones are osteoporotic. IMPRESSION: Mild symmetric narrowing of both hip joints. Bones osteoporotic. No fracture or dislocation. Electronically Signed   By: Lowella Grip III M.D.   On: 06/29/2016 09:07   Ct Femur Right Wo Contrast  Result Date: 06/29/2016 CLINICAL DATA:  [place].  Right hip pain. EXAM: CT OF THE LOWER RIGHT EXTREMITY WITHOUT CONTRAST TECHNIQUE: Multidetector CT imaging of the right lower extremity was performed according to the standard protocol. COMPARISON:  Radiographs, same date. FINDINGS: Of Moderate right hip joint degenerative changes but no acute hip or acetabular fracture. The visualized right hemipelvis is intact. No femur fracture. A total right knee arthroplasty is noted. The components appear well seated without complicating features. No periprosthetic fracture is identified. Suspect small joint effusion. The thigh  musculature appears grossly normal by CT. No mass or hematoma. No significant intra pelvic abnormalities are demonstrated. There does appear to be a calcification noted in the urachal remnant. IMPRESSION: No acute fracture of the right hip or femur. No significant soft tissue abnormality is identified. Right total knee arthroplasty components are intact. Electronically Signed   By: Marijo Sanes M.D.   On: 06/29/2016 12:10   Dg Femur Min 2 Views Right  Result Date: 06/29/2016 CLINICAL DATA:  Pain following fall EXAM: RIGHT FEMUR 2 VIEWS COMPARISON:  None. FINDINGS: Frontal and lateral views were obtained. No acute fracture or dislocation. Patient is status post total knee replacement with prosthetic components well-seated. There is moderate narrowing of the right hip joint. Bones are osteoporotic. There are foci of arterial vascular calcification. IMPRESSION: No acute fracture dislocation. Total knee replacement with prosthetic components well-seated. Narrowing right hip joint. Bones osteoporotic. Atherosclerotic calcification noted in the common femoral,  superficial femoral, and popliteal arteries. Electronically Signed   By: Lowella Grip III M.D.   On: 06/29/2016 09:09    ____________________________________________   PROCEDURES  Procedure(s) performed: None  Procedures  Critical Care performed: No  ____________________________________________   INITIAL IMPRESSION / ASSESSMENT AND PLAN / ED COURSE  Pertinent labs & imaging results that were available during my care of the patient were reviewed by me and considered in my medical decision making (see chart for details).  Patient reports a mechanical fall without recent illness. No associated neurologic, cardiac or pulmonary complaints. He has a focal tenderness along the right buttock, but given his age and throat evaluation is performed. Full range of motion right elbow with an abrasion, but no evidence of other injury.  X-rays  negative for fracture. Patient is able to ambulate, but reports pain along the inner mid left thigh which she describes a "muscle cramp" and also pain along the right buttock. CT scan of the right femur was performed to evaluate for possible fracture, this also was negative. The patient due to issues of his backside able to lay for long periods well, making MRI a poor choice in his evaluation today.  ----------------------------------------- 12:36 PM on 06/29/2016 -----------------------------------------  Patient able to ambulate with walker. Has a walker at home. Has tramadol at home. Discussed with patient, his wife, and his son, they're comfortable with the plan to go home and treat this as a possible contusion. Close follow-up and return precautions advised.  Return precautions and treatment recommendations and follow-up discussed with the patient who is agreeable with the plan.       ____________________________________________   FINAL CLINICAL IMPRESSION(S) / ED DIAGNOSES  Final diagnoses:  Fall, initial encounter  Pelvic contusion, initial encounter      NEW MEDICATIONS STARTED DURING THIS VISIT:  New Prescriptions   No medications on file     Note:  This document was prepared using Dragon voice recognition software and may include unintentional dictation errors.     Delman Kitten, MD 06/29/16 1236

## 2016-06-29 NOTE — ED Triage Notes (Signed)
Pt here after unwitnessed fall from home at 0300, reports right leg pain, hx of stroke on the right side. Pt is alert and oriented, very [place].

## 2016-08-01 DIAGNOSIS — C44529 Squamous cell carcinoma of skin of other part of trunk: Secondary | ICD-10-CM | POA: Diagnosis not present

## 2016-08-31 DIAGNOSIS — M545 Low back pain: Secondary | ICD-10-CM | POA: Diagnosis not present

## 2016-09-19 DIAGNOSIS — E1142 Type 2 diabetes mellitus with diabetic polyneuropathy: Secondary | ICD-10-CM | POA: Diagnosis not present

## 2016-09-19 DIAGNOSIS — B351 Tinea unguium: Secondary | ICD-10-CM | POA: Diagnosis not present

## 2016-09-22 DIAGNOSIS — G8929 Other chronic pain: Secondary | ICD-10-CM | POA: Diagnosis not present

## 2016-09-22 DIAGNOSIS — Z86718 Personal history of other venous thrombosis and embolism: Secondary | ICD-10-CM | POA: Diagnosis not present

## 2016-09-22 DIAGNOSIS — M170 Bilateral primary osteoarthritis of knee: Secondary | ICD-10-CM | POA: Diagnosis not present

## 2016-09-22 DIAGNOSIS — E119 Type 2 diabetes mellitus without complications: Secondary | ICD-10-CM | POA: Diagnosis not present

## 2016-09-22 DIAGNOSIS — I4891 Unspecified atrial fibrillation: Secondary | ICD-10-CM | POA: Diagnosis not present

## 2016-09-22 DIAGNOSIS — I2510 Atherosclerotic heart disease of native coronary artery without angina pectoris: Secondary | ICD-10-CM | POA: Diagnosis not present

## 2016-09-22 DIAGNOSIS — M545 Low back pain: Secondary | ICD-10-CM | POA: Diagnosis not present

## 2016-09-22 DIAGNOSIS — I350 Nonrheumatic aortic (valve) stenosis: Secondary | ICD-10-CM | POA: Diagnosis not present

## 2016-09-22 DIAGNOSIS — M47817 Spondylosis without myelopathy or radiculopathy, lumbosacral region: Secondary | ICD-10-CM | POA: Diagnosis not present

## 2016-09-22 DIAGNOSIS — E785 Hyperlipidemia, unspecified: Secondary | ICD-10-CM | POA: Diagnosis not present

## 2016-09-22 DIAGNOSIS — H25013 Cortical age-related cataract, bilateral: Secondary | ICD-10-CM | POA: Diagnosis not present

## 2016-09-22 DIAGNOSIS — Z86711 Personal history of pulmonary embolism: Secondary | ICD-10-CM | POA: Diagnosis not present

## 2016-09-22 DIAGNOSIS — I10 Essential (primary) hypertension: Secondary | ICD-10-CM | POA: Diagnosis not present

## 2016-09-27 DIAGNOSIS — Z86718 Personal history of other venous thrombosis and embolism: Secondary | ICD-10-CM | POA: Diagnosis not present

## 2016-09-27 DIAGNOSIS — G8929 Other chronic pain: Secondary | ICD-10-CM | POA: Diagnosis not present

## 2016-09-27 DIAGNOSIS — E119 Type 2 diabetes mellitus without complications: Secondary | ICD-10-CM | POA: Diagnosis not present

## 2016-09-27 DIAGNOSIS — I10 Essential (primary) hypertension: Secondary | ICD-10-CM | POA: Diagnosis not present

## 2016-09-27 DIAGNOSIS — H25013 Cortical age-related cataract, bilateral: Secondary | ICD-10-CM | POA: Diagnosis not present

## 2016-09-27 DIAGNOSIS — Z86711 Personal history of pulmonary embolism: Secondary | ICD-10-CM | POA: Diagnosis not present

## 2016-09-27 DIAGNOSIS — E785 Hyperlipidemia, unspecified: Secondary | ICD-10-CM | POA: Diagnosis not present

## 2016-09-27 DIAGNOSIS — I2510 Atherosclerotic heart disease of native coronary artery without angina pectoris: Secondary | ICD-10-CM | POA: Diagnosis not present

## 2016-09-27 DIAGNOSIS — M545 Low back pain: Secondary | ICD-10-CM | POA: Diagnosis not present

## 2016-09-27 DIAGNOSIS — I4891 Unspecified atrial fibrillation: Secondary | ICD-10-CM | POA: Diagnosis not present

## 2016-09-27 DIAGNOSIS — M170 Bilateral primary osteoarthritis of knee: Secondary | ICD-10-CM | POA: Diagnosis not present

## 2016-09-27 DIAGNOSIS — M47817 Spondylosis without myelopathy or radiculopathy, lumbosacral region: Secondary | ICD-10-CM | POA: Diagnosis not present

## 2016-09-27 DIAGNOSIS — I350 Nonrheumatic aortic (valve) stenosis: Secondary | ICD-10-CM | POA: Diagnosis not present

## 2016-12-26 DIAGNOSIS — M5440 Lumbago with sciatica, unspecified side: Secondary | ICD-10-CM | POA: Diagnosis not present

## 2016-12-26 DIAGNOSIS — R29898 Other symptoms and signs involving the musculoskeletal system: Secondary | ICD-10-CM | POA: Diagnosis not present

## 2016-12-26 DIAGNOSIS — R2681 Unsteadiness on feet: Secondary | ICD-10-CM | POA: Diagnosis not present

## 2016-12-26 DIAGNOSIS — G8929 Other chronic pain: Secondary | ICD-10-CM | POA: Diagnosis not present

## 2016-12-26 DIAGNOSIS — R454 Irritability and anger: Secondary | ICD-10-CM | POA: Diagnosis not present

## 2017-04-05 DIAGNOSIS — L570 Actinic keratosis: Secondary | ICD-10-CM | POA: Diagnosis not present

## 2017-04-05 DIAGNOSIS — C44319 Basal cell carcinoma of skin of other parts of face: Secondary | ICD-10-CM | POA: Diagnosis not present

## 2017-04-05 DIAGNOSIS — D485 Neoplasm of uncertain behavior of skin: Secondary | ICD-10-CM | POA: Diagnosis not present

## 2017-04-05 DIAGNOSIS — L821 Other seborrheic keratosis: Secondary | ICD-10-CM | POA: Diagnosis not present

## 2017-04-05 DIAGNOSIS — Z85828 Personal history of other malignant neoplasm of skin: Secondary | ICD-10-CM | POA: Diagnosis not present

## 2017-04-05 DIAGNOSIS — L812 Freckles: Secondary | ICD-10-CM | POA: Diagnosis not present

## 2017-04-05 DIAGNOSIS — C44329 Squamous cell carcinoma of skin of other parts of face: Secondary | ICD-10-CM | POA: Diagnosis not present

## 2017-04-05 DIAGNOSIS — L578 Other skin changes due to chronic exposure to nonionizing radiation: Secondary | ICD-10-CM | POA: Diagnosis not present

## 2017-04-05 DIAGNOSIS — L820 Inflamed seborrheic keratosis: Secondary | ICD-10-CM | POA: Diagnosis not present

## 2017-07-27 ENCOUNTER — Ambulatory Visit: Payer: Self-pay | Admitting: Podiatry

## 2017-07-30 DEATH — deceased

## 2018-12-02 IMAGING — CR DG THORACIC SPINE 2V
2 series · 3 of 3 positions shown · non-contrast
Comparison: Lumbar spine reported separately.

CLINICAL DATA: Unwitnessed fall at home earlier today.  Back pain.

EXAM:
THORACIC SPINE 2 VIEWS

[t-spine ap]
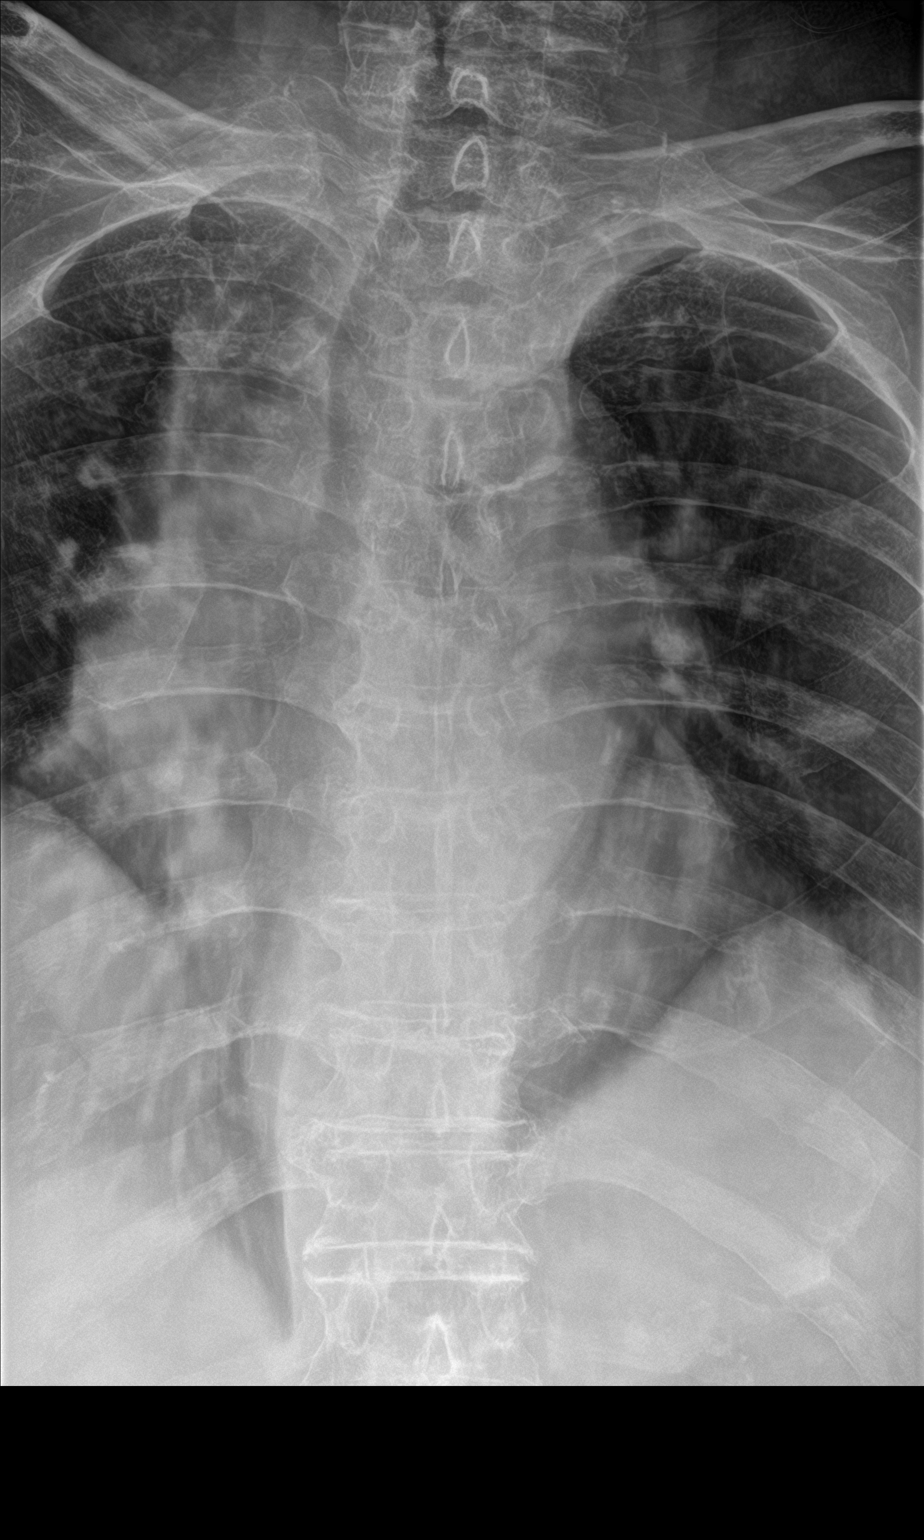

[Series 2: t-spine lat · 0.14mm/px · 2 of 2 slices shown]
[im 1/2]
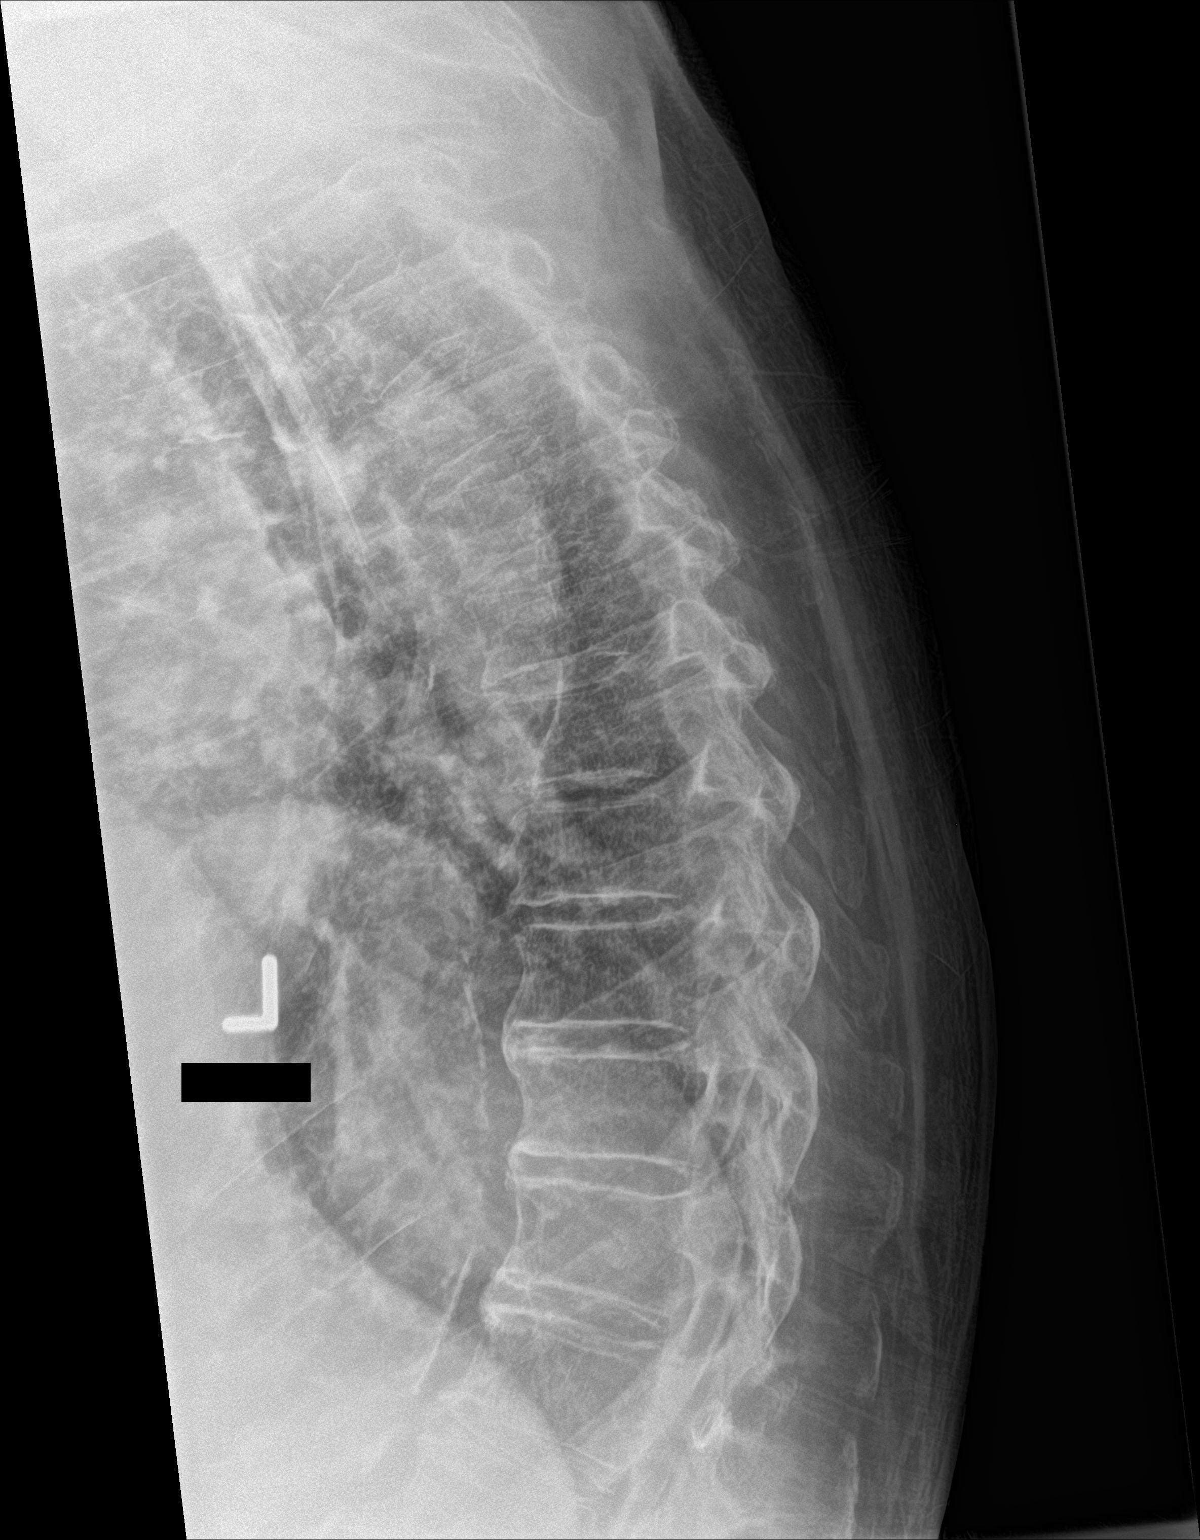
[im 2/2]
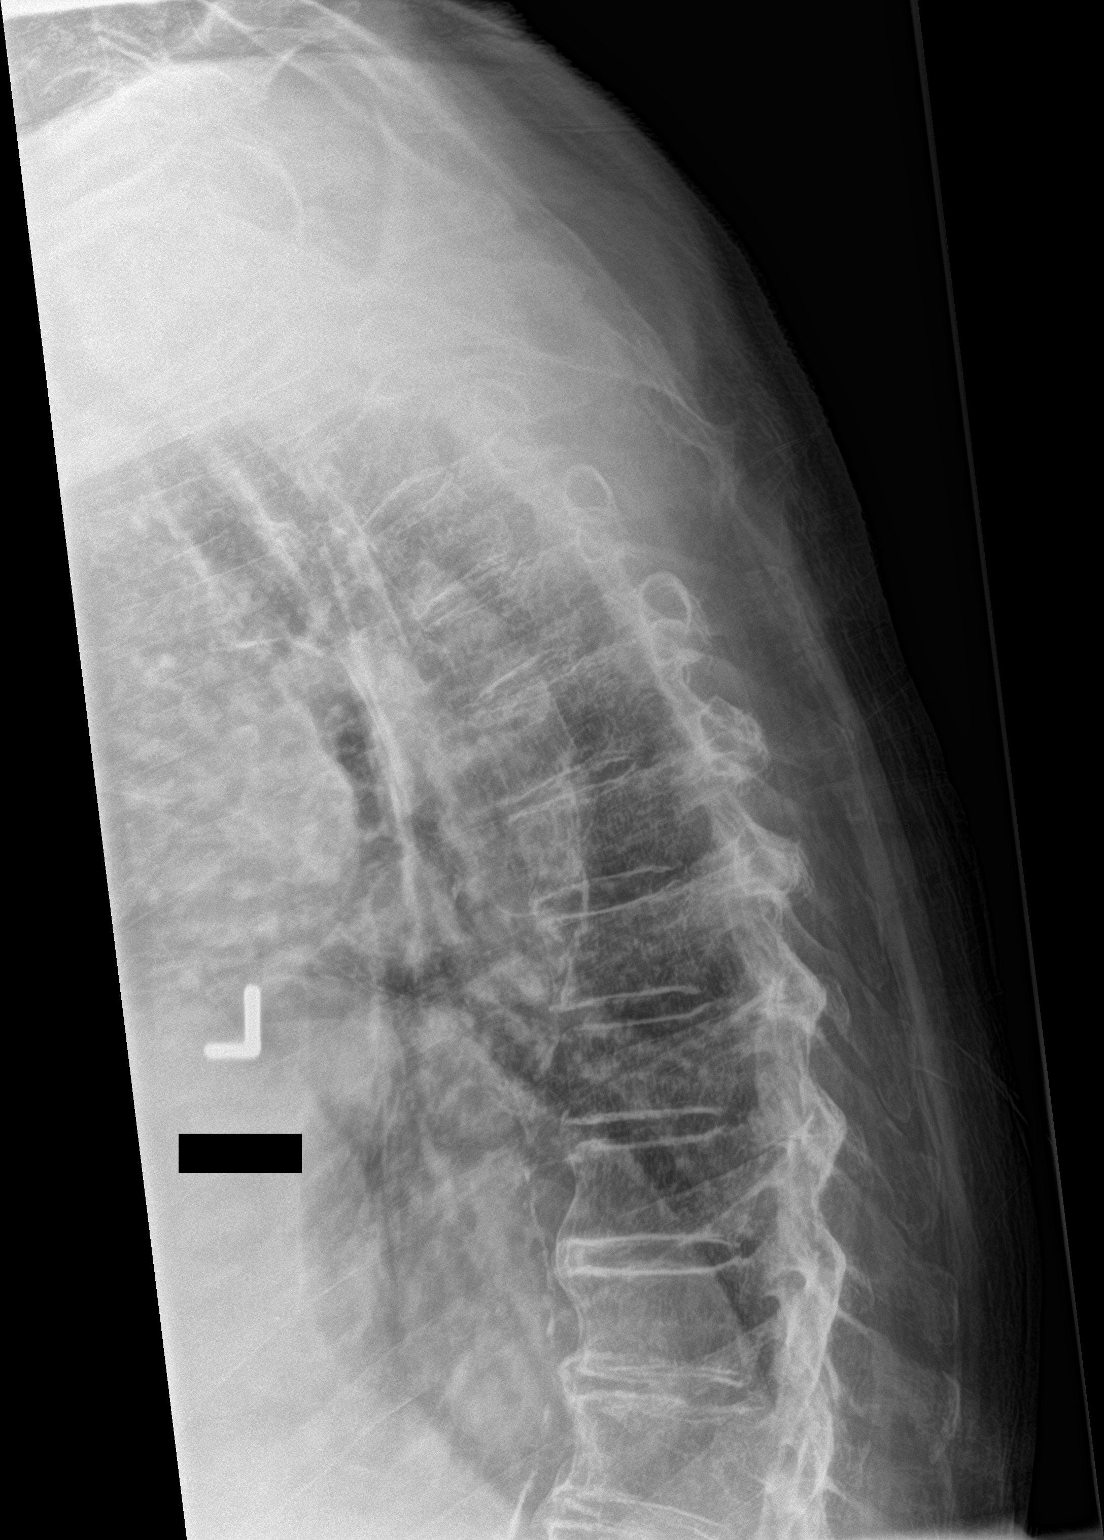

[3 of 3 positions shown; findings below may reference images not displayed]

FINDINGS: There is no evidence of thoracic spine fracture. Alignment is
normal. No other significant bone abnormalities are identified.
Osteopenia. Flowing anterior osteophytes. Vascular calcification.
IMPRESSION: No definite thoracic spine fracture.

## 2018-12-02 IMAGING — CT CT FEMUR *R* W/O CM
3 of 4 series · 9 of 33 positions shown, 11 images · non-contrast
Comparison: Radiographs, same date.

CLINICAL DATA: Fell.  Right hip pain.

EXAM:
CT OF THE LOWER RIGHT EXTREMITY WITHOUT CONTRAST
TECHNIQUE: Multidetector CT imaging of the right lower extremity was performed
according to the standard protocol.

[Series 7: axial st · axial · 0.48mm/px · z∈[-1278,-1278]mm · 1 of 375 slices shown, 2 images]
[im 188/375  soft-tissue]
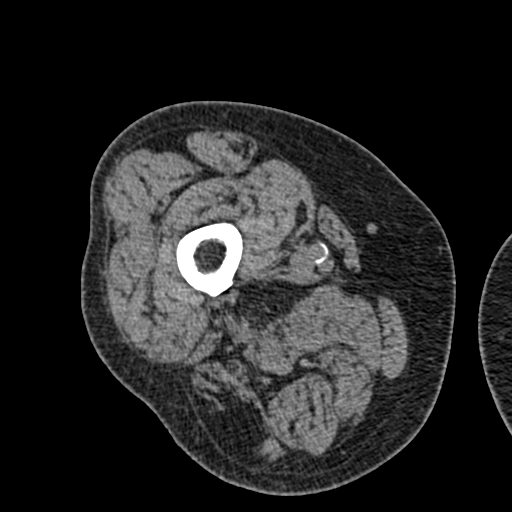
[im 188/375  bone]
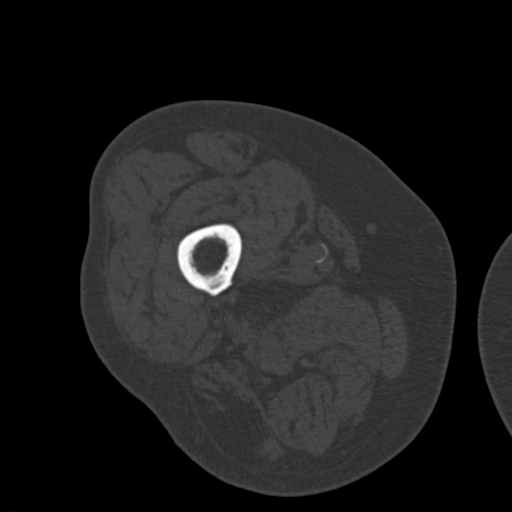

[Series 8: coronal st · coronal · 0.54mm/px · 3 of 189 slices shown]
[im 38/189  bone]
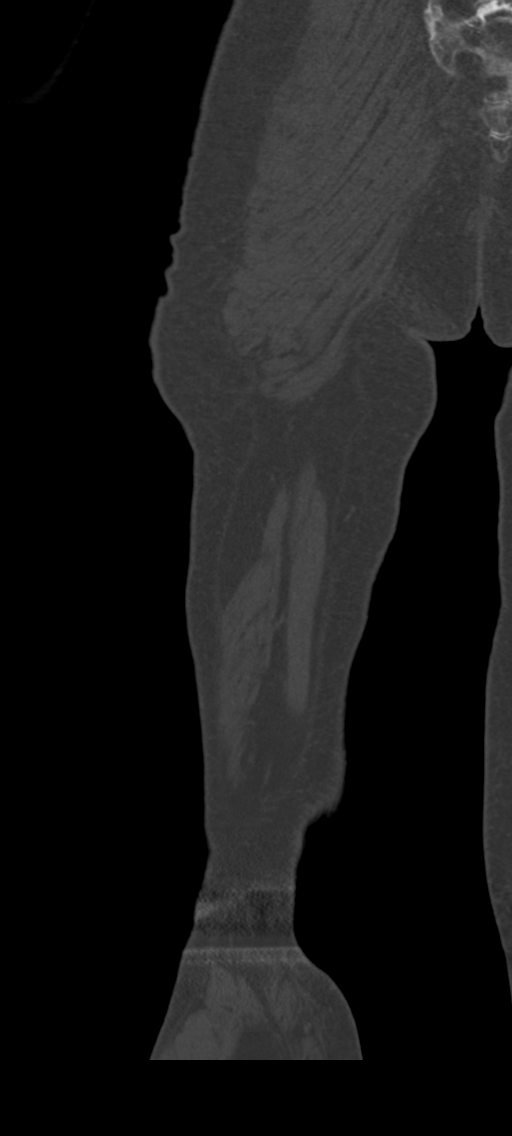
[im 76/189  bone]
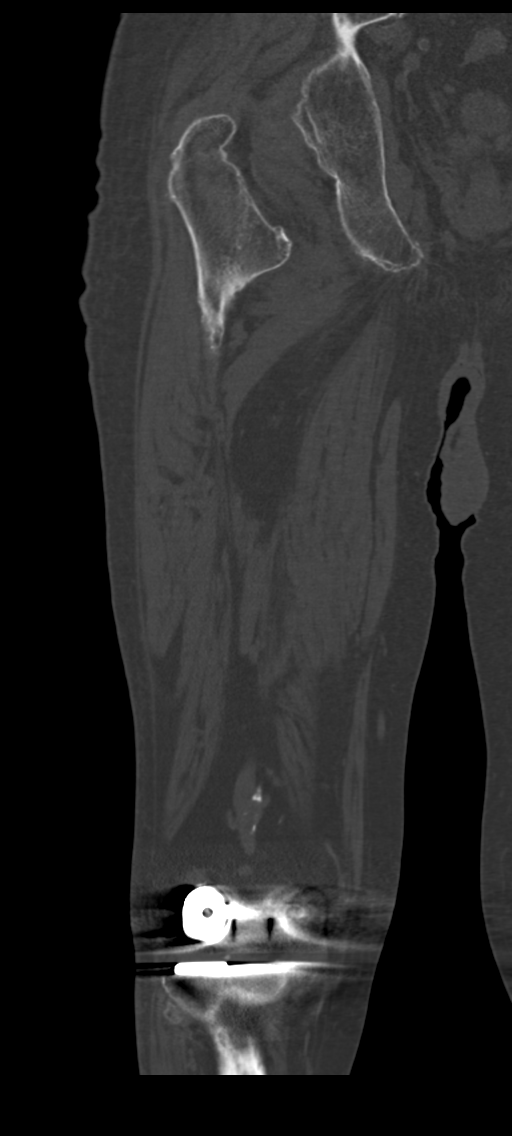
[im 113/189  bone]
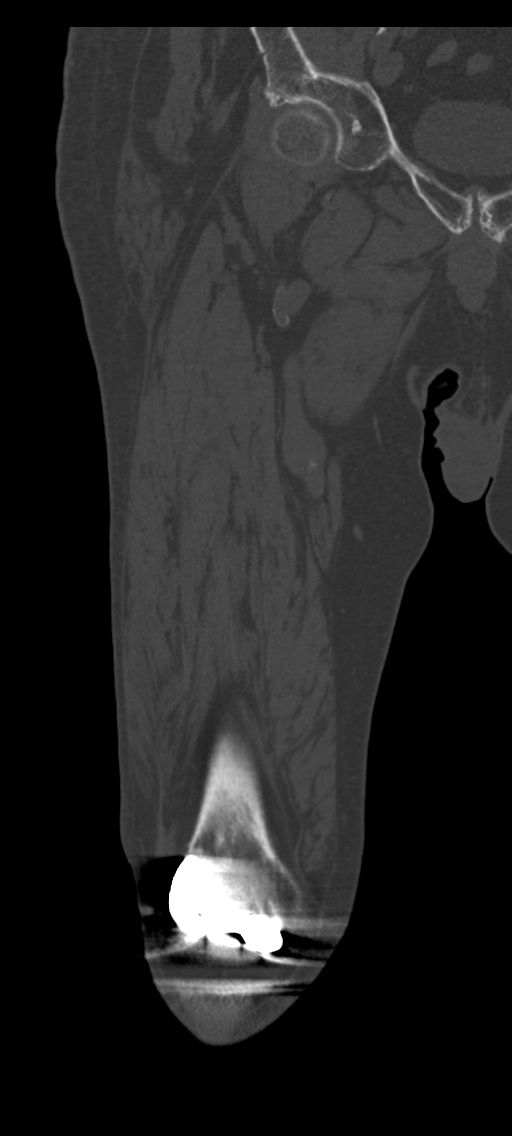

[Series 9: sagittal st · sagittal · 0.60mm/px · 5 of 158 slices shown, 6 images]
[im 53/158  bone]
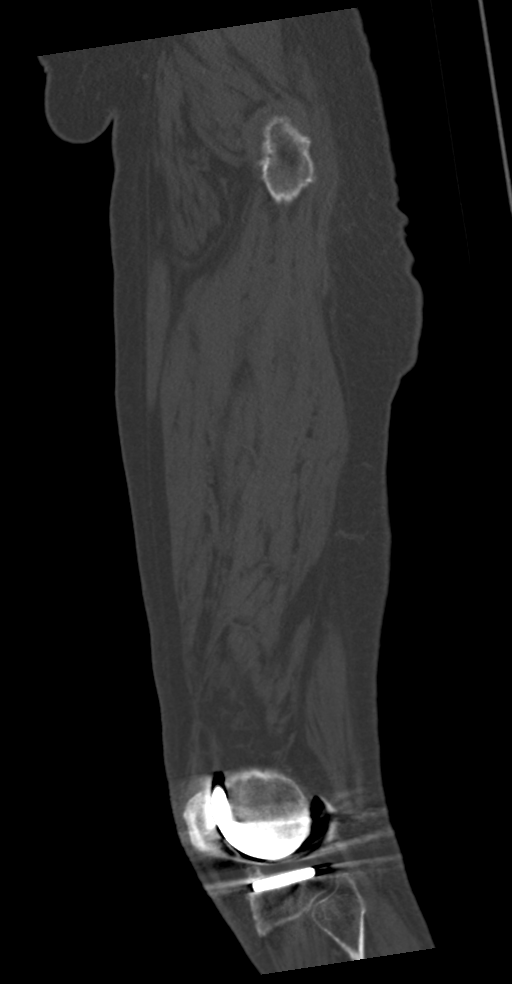
[im 66/158  bone]
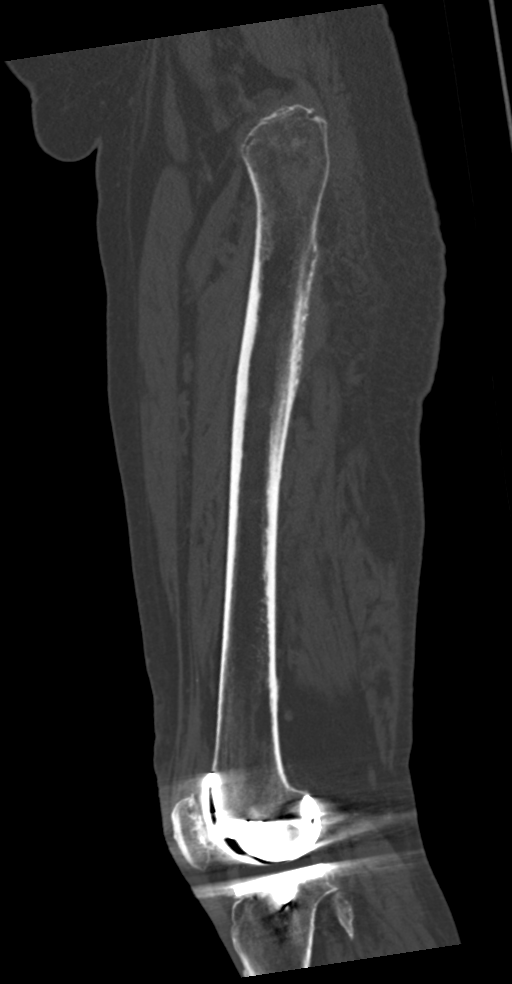
[im 79/158  soft-tissue]
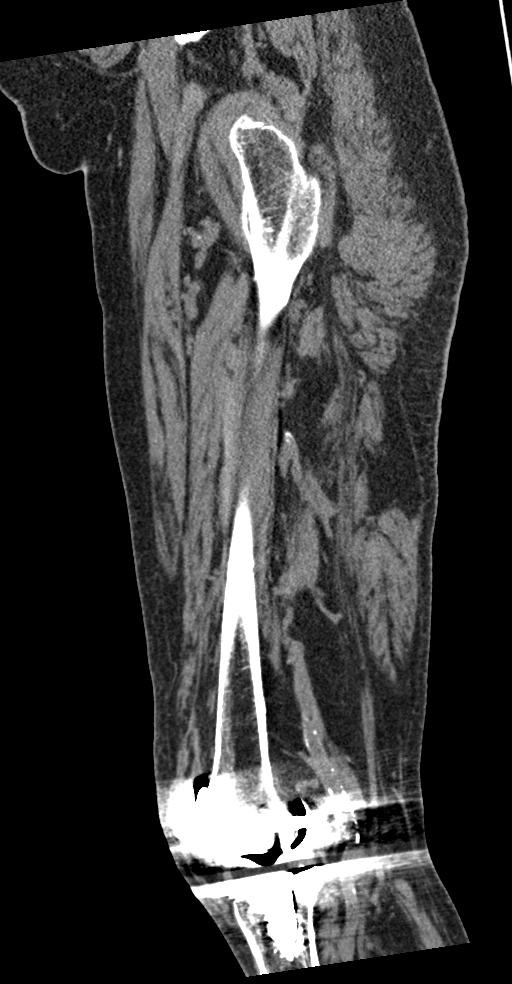
[im 79/158  bone]
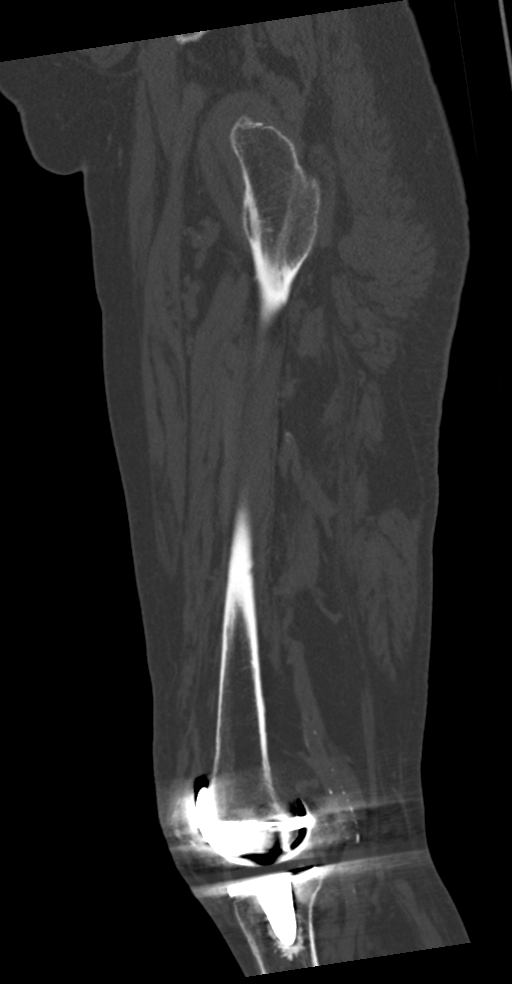
[im 92/158  bone]
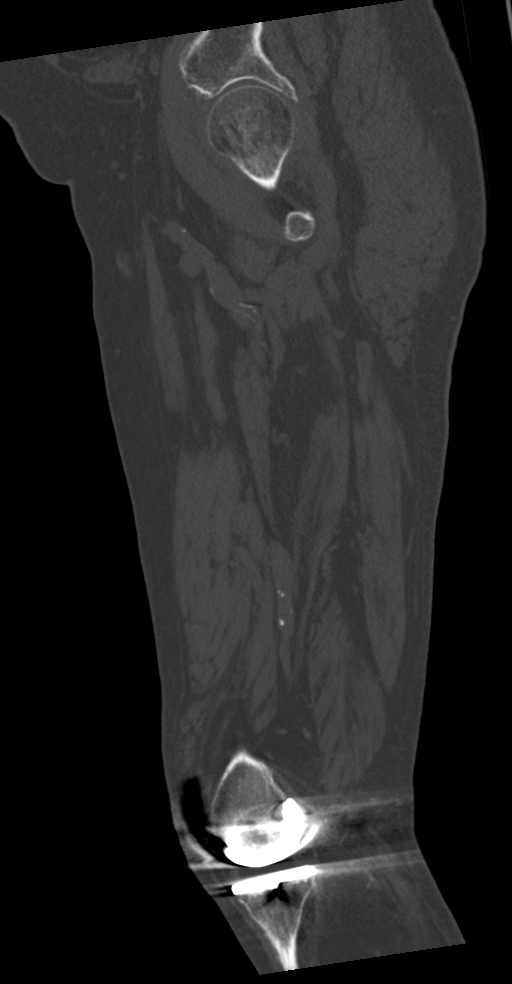
[im 105/158  bone]
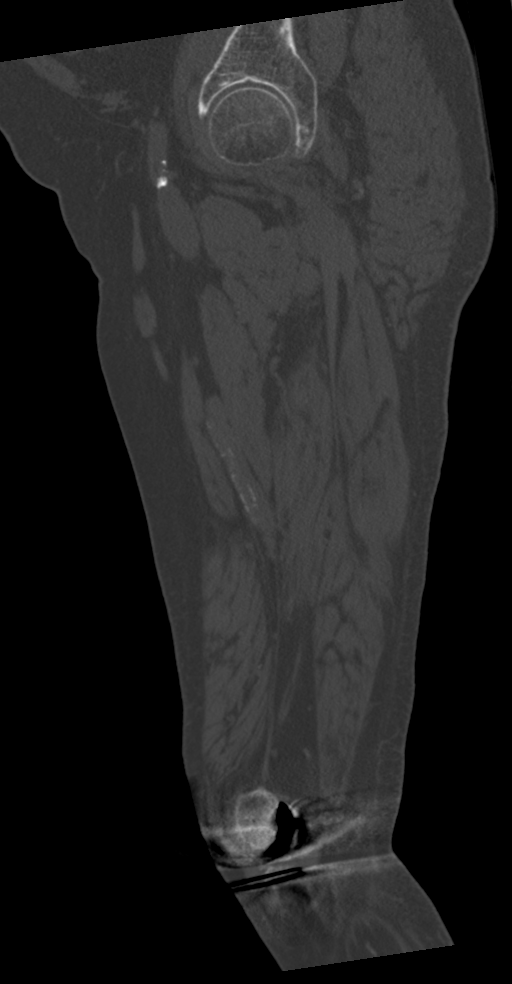

[9 of 33 positions shown; findings below may reference images not displayed]

FINDINGS: Of

Moderate right hip joint degenerative changes but no acute hip or
acetabular fracture. The visualized right hemipelvis is intact.

No femur fracture.

A total right knee arthroplasty is noted. The components appear well
seated without complicating features. No periprosthetic fracture is
identified. Suspect small joint effusion.

The thigh musculature appears grossly normal by CT. No mass or
hematoma.

No significant intra pelvic abnormalities are demonstrated. There
does appear to be a calcification noted in the urachal remnant.
IMPRESSION: No acute fracture of the right hip or femur.

No significant soft tissue abnormality is identified.

Right total knee arthroplasty components are intact.
